# Patient Record
Sex: Female | Born: 1955 | Race: White | Hispanic: No | Marital: Married | Smoking: Current every day smoker
Health system: Southern US, Community
[De-identification: ages and names within clinical notes are randomized; demographics above are authoritative.]

---

## 2006-10-13 ENCOUNTER — Emergency Department: Payer: Self-pay | Admitting: Emergency Medicine

## 2009-06-24 ENCOUNTER — Emergency Department: Payer: Self-pay | Admitting: Emergency Medicine

## 2010-05-21 ENCOUNTER — Emergency Department: Payer: Self-pay | Admitting: Emergency Medicine

## 2011-02-20 ENCOUNTER — Emergency Department: Payer: Self-pay | Admitting: Emergency Medicine

## 2012-02-18 IMAGING — CR DG CHEST 1V PORT
1 series · 1 of 1 positions shown · non-contrast
Comparison: none

REASON FOR EXAM: chest pain
COMMENTS:

PROCEDURE:     DXR - DXR PORTABLE CHEST SINGLE VIEW  - February 20, 2011  [DATE]
RESULT:     Comparison: None

[view not recorded]
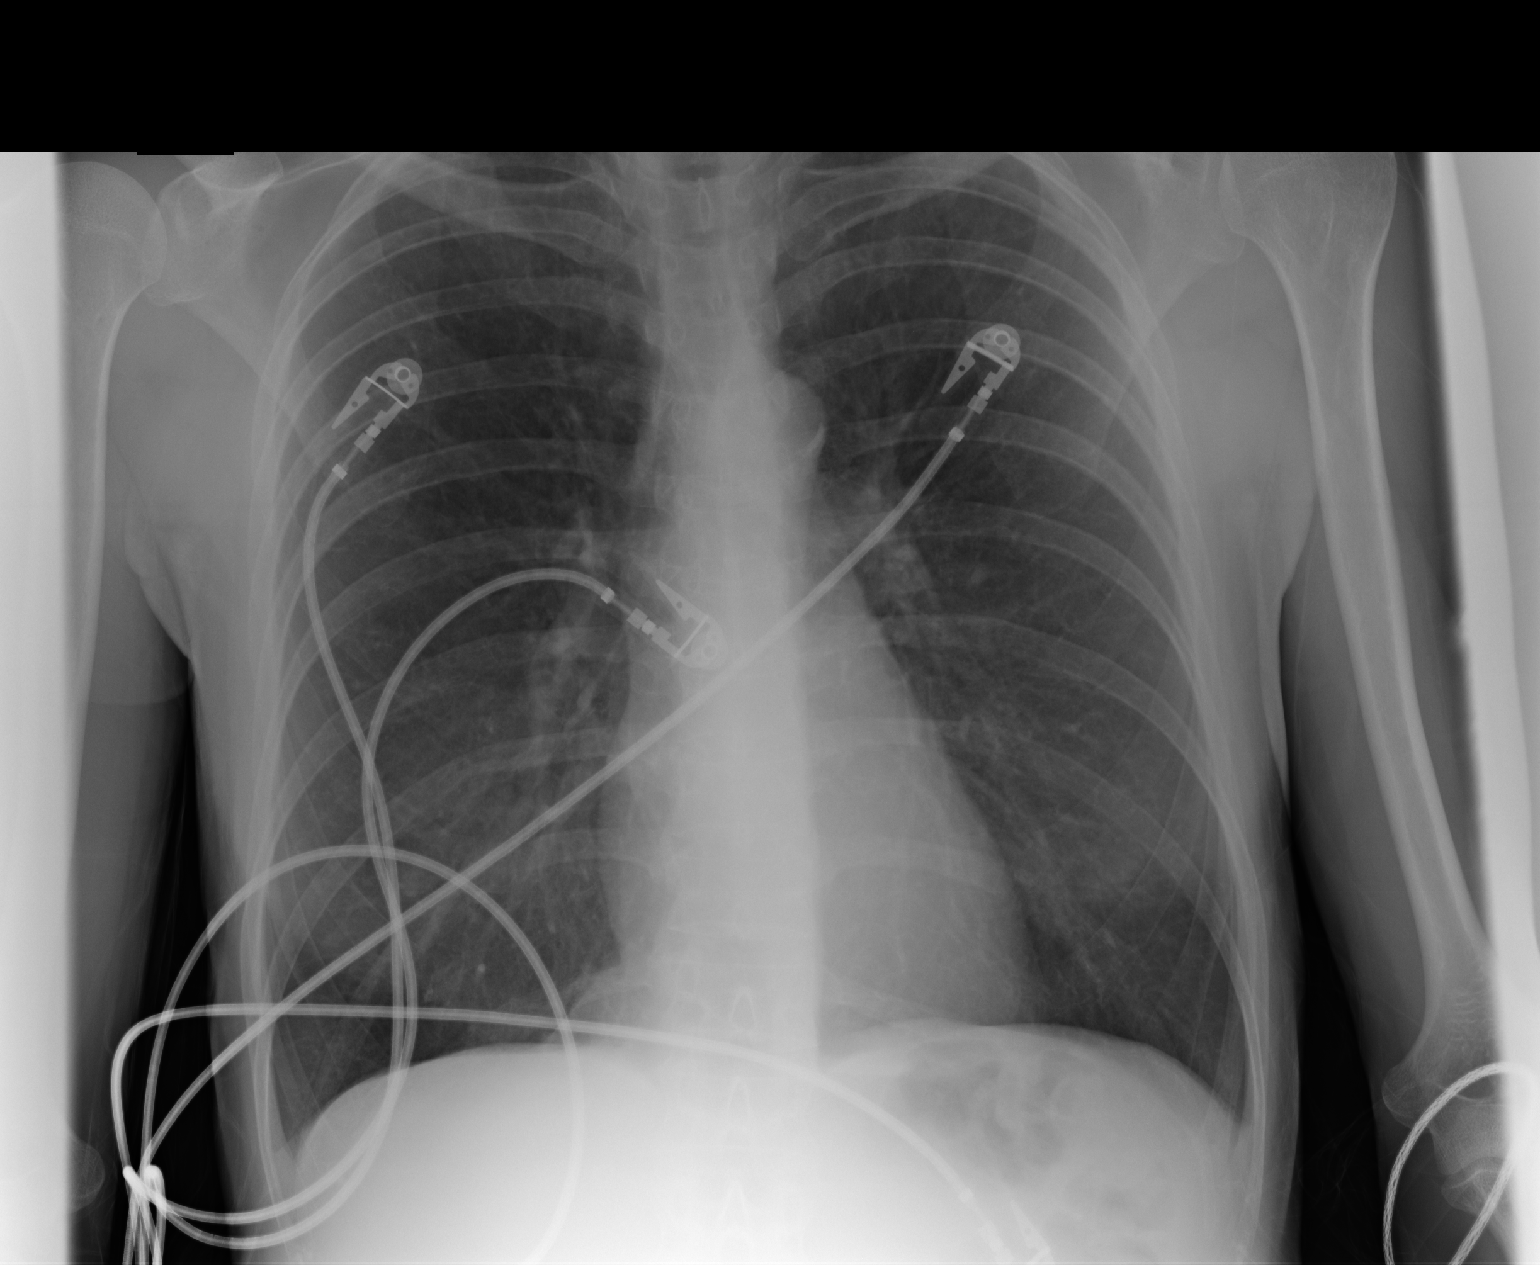

[1 of 1 positions shown; findings below may reference images not displayed]

FINDINGS: Single portable AP chest radiograph is provided.  There is no focal
parenchymal opacity, pleural effusion, or pneumothorax. There is a 4 mm
nodular density projecting in the left perihilar region which may represent
a vessel on end versus a small pulmonary nodule. Normal cardiomediastinal
silhouette. The osseous structures are unremarkable.
IMPRESSION: No acute disease of the chest.

There is a 4 mm nodular density projecting in the left perihilar region
which may represent a vessel on end versus a small pulmonary nodule.
Recommend followup chest radiographs in 3 months.

## 2016-07-14 ENCOUNTER — Emergency Department
Admission: EM | Admit: 2016-07-14 | Discharge: 2016-07-14 | Disposition: A | Discharge status: Home / Self Care | Payer: No Typology Code available for payment source | Attending: Emergency Medicine | Admitting: Emergency Medicine

## 2016-07-14 ENCOUNTER — Encounter: Payer: Self-pay | Admitting: Emergency Medicine

## 2016-07-14 DIAGNOSIS — Y92410 Unspecified street and highway as the place of occurrence of the external cause: Secondary | ICD-10-CM | POA: Insufficient documentation

## 2016-07-14 DIAGNOSIS — S161XXA Strain of muscle, fascia and tendon at neck level, initial encounter: Secondary | ICD-10-CM | POA: Insufficient documentation

## 2016-07-14 DIAGNOSIS — Y999 Unspecified external cause status: Secondary | ICD-10-CM | POA: Insufficient documentation

## 2016-07-14 DIAGNOSIS — S39012A Strain of muscle, fascia and tendon of lower back, initial encounter: Secondary | ICD-10-CM | POA: Insufficient documentation

## 2016-07-14 DIAGNOSIS — F17210 Nicotine dependence, cigarettes, uncomplicated: Secondary | ICD-10-CM | POA: Insufficient documentation

## 2016-07-14 DIAGNOSIS — Y9389 Activity, other specified: Secondary | ICD-10-CM | POA: Insufficient documentation

## 2016-07-14 DIAGNOSIS — S3992XA Unspecified injury of lower back, initial encounter: Secondary | ICD-10-CM | POA: Diagnosis present

## 2016-07-14 MED ORDER — MELOXICAM 7.5 MG PO TABS
7.5000 mg | ORAL_TABLET | Freq: Once | ORAL | Status: AC
  Administered 2016-07-14: 7.5 mg via ORAL
  Filled 2016-07-14: qty 1

## 2016-07-14 MED ORDER — MELOXICAM 15 MG PO TABS: 15.0000 mg | ORAL_TABLET | Freq: Every day | ORAL | 0 refills | Status: AC

## 2016-07-14 MED ORDER — CYCLOBENZAPRINE HCL 10 MG PO TABS: 10.0000 mg | ORAL_TABLET | Freq: Three times a day (TID) | ORAL | 0 refills | Status: AC | PRN
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
